# Patient Record
Sex: Male | Born: 1945
Health system: Southern US, Community
[De-identification: ages and names within clinical notes are randomized; demographics above are authoritative.]

## PROBLEM LIST (undated history)

## (undated) DIAGNOSIS — F32A Depression, unspecified: Secondary | ICD-10-CM

## (undated) DIAGNOSIS — F329 Major depressive disorder, single episode, unspecified: Secondary | ICD-10-CM

## (undated) HISTORY — PX: INGUINAL HERNIA REPAIR: SUR1180

## (undated) HISTORY — DX: Depression, unspecified: F32.A

---
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
## 2019-06-26 DIAGNOSIS — R7989 Other specified abnormal findings of blood chemistry: Secondary | ICD-10-CM | POA: Diagnosis not present

## 2019-06-26 DIAGNOSIS — Z0000 Encounter for general adult medical examination without abnormal findings: Secondary | ICD-10-CM | POA: Diagnosis not present

## 2019-06-26 DIAGNOSIS — R69 Illness, unspecified: Secondary | ICD-10-CM | POA: Diagnosis not present

## 2019-06-26 DIAGNOSIS — Z1331 Encounter for screening for depression: Secondary | ICD-10-CM | POA: Diagnosis not present

## 2019-06-26 DIAGNOSIS — E559 Vitamin D deficiency, unspecified: Secondary | ICD-10-CM | POA: Diagnosis not present

## 2019-06-26 DIAGNOSIS — Z13220 Encounter for screening for lipoid disorders: Secondary | ICD-10-CM | POA: Diagnosis not present

## 2019-06-26 DIAGNOSIS — Z125 Encounter for screening for malignant neoplasm of prostate: Secondary | ICD-10-CM | POA: Diagnosis not present

## 2019-07-10 ENCOUNTER — Encounter: Payer: Self-pay | Admitting: Gastroenterology

## 2019-08-06 ENCOUNTER — Other Ambulatory Visit: Payer: Self-pay

## 2019-08-06 ENCOUNTER — Ambulatory Visit (AMBULATORY_SURGERY_CENTER): Payer: Self-pay | Admitting: *Deleted

## 2019-08-06 VITALS — Temp 97.7°F | Ht 74.0 in | Wt 157.2 lb

## 2019-08-06 DIAGNOSIS — Z1211 Encounter for screening for malignant neoplasm of colon: Secondary | ICD-10-CM

## 2019-08-06 MED ORDER — NA SULFATE-K SULFATE-MG SULF 17.5-3.13-1.6 GM/177ML PO SOLN: 1.0000 | Freq: Once | ORAL | 0 refills | Status: AC

## 2019-08-06 NOTE — Progress Notes (Signed)

## 2019-08-13 ENCOUNTER — Encounter: Payer: Self-pay | Admitting: Gastroenterology

## 2019-08-15 ENCOUNTER — Other Ambulatory Visit: Payer: Self-pay

## 2019-08-15 ENCOUNTER — Encounter: Payer: Self-pay | Admitting: Gastroenterology

## 2019-08-15 ENCOUNTER — Ambulatory Visit (AMBULATORY_SURGERY_CENTER): Payer: Medicare HMO | Admitting: Gastroenterology

## 2019-08-15 VITALS — BP 110/63 | HR 68 | Temp 97.1°F | Resp 13 | Ht 74.0 in | Wt 157.0 lb

## 2019-08-15 DIAGNOSIS — D122 Benign neoplasm of ascending colon: Secondary | ICD-10-CM

## 2019-08-15 DIAGNOSIS — Z1211 Encounter for screening for malignant neoplasm of colon: Secondary | ICD-10-CM

## 2019-08-15 DIAGNOSIS — D125 Benign neoplasm of sigmoid colon: Secondary | ICD-10-CM

## 2019-08-15 DIAGNOSIS — D120 Benign neoplasm of cecum: Secondary | ICD-10-CM

## 2019-08-15 MED ORDER — SODIUM CHLORIDE 0.9 % IV SOLN: 500.0000 mL | Freq: Once | INTRAVENOUS | Status: DC

## 2019-08-15 NOTE — Progress Notes (Signed)
Report given to PACU, vss 

## 2019-08-15 NOTE — Op Note (Signed)
[facility] Patient Name: Benjamin Fernandez Procedure Date: 08/15/2019 9:10 AM MRN: [id number] Endoscopist: Remo Lipps P. Havery Moros , MD Age: 74 Referring MD:  Date of Birth: 1946/02/15 Gender: Male Account #: [id number] Procedure:                Colonoscopy Indications:              Screening for colorectal malignant neoplasm Medicines:                Monitored Anesthesia Care Procedure:                Pre-Anesthesia Assessment:                           - Prior to the procedure, a History and Physical                            was performed, and patient medications and                            allergies were reviewed. The patient's tolerance of                            previous anesthesia was also reviewed. The risks                            and benefits of the procedure and the sedation                            options and risks were discussed with the patient.                            All questions were answered, and informed consent                            was obtained. Prior Anticoagulants: The patient has                            taken no previous anticoagulant or antiplatelet                            agents. ASA Grade Assessment: II - A patient with                            mild systemic disease. After reviewing the risks                            and benefits, the patient was deemed in                            satisfactory condition to undergo the procedure.                           After obtaining informed consent, the colonoscope  was passed under direct vision. Throughout the                            procedure, the patient's blood pressure, pulse, and                            oxygen saturations were monitored continuously. The                            Colonoscope was introduced through the anus and                            advanced to the the cecum, identified by                            appendiceal orifice  and ileocecal valve. The                            colonoscopy was performed without difficulty. The                            patient tolerated the procedure well. The quality                            of the bowel preparation was good. The ileocecal                            valve, appendiceal orifice, and rectum were                            photographed. Scope In: 9:14:12 AM Scope Out: 9:39:39 AM Scope Withdrawal Time: 0 hours 21 minutes 58 seconds  Total Procedure Duration: 0 hours 25 minutes 27 seconds  Findings:                 The perianal and digital rectal examinations were                            normal.                           A diminutive polyp was found in the cecum. The                            polyp was sessile. The polyp was removed with a                            cold snare. Resection and retrieval were complete.                           Three flat and sessile polyps were found in the                            ascending colon. The polyps were 3 mm in size.  These polyps were removed with a cold snare.                            Resection and retrieval were complete.                           A 3 mm polyp was found in the sigmoid colon. The                            polyp was sessile. The polyp was removed with a                            cold snare. Resection and retrieval were complete.                           Multiple small-mouthed diverticula were found in                            the left colon.                           Internal hemorrhoids were found during retroflexion.                           The colon was extremely tortous. The exam was                            otherwise without abnormality. Complications:            No immediate complications. Estimated blood loss:                            Minimal. Estimated Blood Loss:     Estimated blood loss was minimal. Impression:               - Tortous colon which  prolonged this exam.                           - One diminutive polyp in the cecum, removed with a                            cold snare. Resected and retrieved.                           - Three 3 mm polyps in the ascending colon, removed                            with a cold snare. Resected and retrieved.                           - One 3 mm polyp in the sigmoid colon, removed with                            a cold snare. Resected and retrieved.                           -  Diverticulosis in the left colon.                           - Internal hemorrhoids.                           - The examination was otherwise normal. Recommendation:           - Patient has a contact number available for                            emergencies. The signs and symptoms of potential                            delayed complications were discussed with the                            patient. Return to normal activities tomorrow.                            Written discharge instructions were provided to the                            patient.                           - Resume previous diet.                           - Continue present medications.                           - Await pathology results. Remo Lipps P. Lot Medford, MD 08/15/2019 9:45:48 AM This report has been signed electronically.

## 2019-08-15 NOTE — Progress Notes (Signed)
Called to room to assist during endoscopic procedure.  Patient ID and intended procedure confirmed with present staff. Received instructions for my participation in the procedure from the performing physician.  

## 2019-08-15 NOTE — Patient Instructions (Signed)
Handouts on polyps and diverticulosis given to you today  °Await pathology results  ° °YOU HAD AN ENDOSCOPIC PROCEDURE TODAY AT THE [facility]:   Refer to the procedure report that was given to you for any specific questions about what was found during the examination.  If the procedure report does not answer your questions, please call your gastroenterologist to clarify.  If you requested that your care partner not be given the details of your procedure findings, then the procedure report has been included in a sealed envelope for you to review at your convenience later. ° °YOU SHOULD EXPECT: Some feelings of bloating in the abdomen. Passage of more gas than usual.  Walking can help get rid of the air that was put into your GI tract during the procedure and reduce the bloating. If you had a lower endoscopy (such as a colonoscopy or flexible sigmoidoscopy) you may notice spotting of blood in your stool or on the toilet paper. If you underwent a bowel prep for your procedure, you may not have a normal bowel movement for a few days. ° °Please Note:  You might notice some irritation and congestion in your nose or some drainage.  This is from the oxygen used during your procedure.  There is no need for concern and it should clear up in a day or so. ° °SYMPTOMS TO REPORT IMMEDIATELY: ° °Following lower endoscopy (colonoscopy or flexible sigmoidoscopy): ° Excessive amounts of blood in the stool ° Significant tenderness or worsening of abdominal pains ° Swelling of the abdomen that is new, acute ° Fever of 100°F or higher ° °For urgent or emergent issues, a gastroenterologist can be reached at any hour by calling [phone]. °Do not use MyChart messaging for urgent concerns.  ° ° °DIET:  We do recommend a small meal at first, but then you may proceed to your regular diet.  Drink plenty of fluids but you should avoid alcoholic beverages for 24 hours. ° °ACTIVITY:  You should plan to take it easy for the  rest of today and you should NOT DRIVE or use heavy machinery until tomorrow (because of the sedation medicines used during the test).   ° °FOLLOW UP: °Our staff will call the number listed on your records 48-72 hours following your procedure to check on you and address any questions or concerns that you may have regarding the information given to you following your procedure. If we do not reach you, we will leave a message.  We will attempt to reach you two times.  During this call, we will ask if you have developed any symptoms of COVID 19. If you develop any symptoms (ie: fever, flu-like symptoms, shortness of breath, cough etc.) before then, please call [phone].  If you test positive for Covid 19 in the 2 weeks post procedure, please call and report this information to us.   ° °If any biopsies were taken you will be contacted by phone or by letter within the next 1-3 weeks.  Please call us at [phone] if you have not heard about the biopsies in 3 weeks.  ° ° °SIGNATURES/CONFIDENTIALITY: °You and/or your care partner have signed paperwork which will be entered into your electronic medical record.  These signatures attest to the fact that that the information above on your After Visit Summary has been reviewed and is understood.  Full responsibility of the confidentiality of this discharge information lies with you and/or your care-partner.  °

## 2019-08-15 NOTE — Progress Notes (Signed)
Pt's states no medical or surgical changes since previsit or office visit.  JB - temp CW - vitals. 

## 2019-08-17 ENCOUNTER — Telehealth: Payer: Self-pay | Admitting: *Deleted

## 2019-08-17 NOTE — Telephone Encounter (Signed)
  Follow up Call-  Call back number 08/15/2019  Post procedure Call Back phone  # [phone]  Permission to leave phone message Yes     Patient questions:  Do you have a fever, pain , or abdominal swelling? No. Pain Score  0 *  Have you tolerated food without any problems? Yes.    Have you been able to return to your normal activities? Yes.    Do you have any questions about your discharge instructions: Diet   No. Medications  No. Follow up visit  No.  Do you have questions or concerns about your Care? No.  Actions: * If pain score is 4 or above: No action needed, pain <4.  1. Have you developed a fever since your procedure? no  2.   Have you had an respiratory symptoms (SOB or cough) since your procedure? no  3.   Have you tested positive for COVID 19 since your procedure no  4.   Have you had any family members/close contacts diagnosed with the COVID 19 since your procedure?  no   If yes to any of these questions please route to Joylene John, RN and Alphonsa Gin, Therapist, sports.

## 2019-08-21 ENCOUNTER — Encounter: Payer: Self-pay | Admitting: Gastroenterology

## 2019-08-23 DIAGNOSIS — R69 Illness, unspecified: Secondary | ICD-10-CM | POA: Diagnosis not present

## 2019-08-23 DIAGNOSIS — R0609 Other forms of dyspnea: Secondary | ICD-10-CM | POA: Diagnosis not present

## 2019-08-23 DIAGNOSIS — R030 Elevated blood-pressure reading, without diagnosis of hypertension: Secondary | ICD-10-CM | POA: Diagnosis not present

## 2019-09-03 DIAGNOSIS — R0609 Other forms of dyspnea: Secondary | ICD-10-CM | POA: Diagnosis not present

## 2019-09-11 ENCOUNTER — Other Ambulatory Visit (HOSPITAL_COMMUNITY): Payer: Self-pay | Admitting: Internal Medicine

## 2019-09-11 DIAGNOSIS — R0609 Other forms of dyspnea: Secondary | ICD-10-CM

## 2019-09-21 ENCOUNTER — Telehealth (HOSPITAL_COMMUNITY): Payer: Self-pay

## 2019-09-21 NOTE — Telephone Encounter (Signed)
Encounter complete. 

## 2019-09-22 ENCOUNTER — Other Ambulatory Visit (HOSPITAL_COMMUNITY)
Admission: RE | Admit: 2019-09-22 | Discharge: 2019-09-22 | Disposition: A | Discharge status: Home / Self Care | Payer: Medicare HMO | Source: Ambulatory Visit | Attending: Internal Medicine | Admitting: Internal Medicine

## 2019-09-22 DIAGNOSIS — Z01812 Encounter for preprocedural laboratory examination: Secondary | ICD-10-CM | POA: Insufficient documentation

## 2019-09-22 DIAGNOSIS — Z20822 Contact with and (suspected) exposure to covid-19: Secondary | ICD-10-CM | POA: Insufficient documentation

## 2019-09-22 LAB — SARS CORONAVIRUS 2 (TAT 6-24 HRS): SARS Coronavirus 2: NEGATIVE

## 2019-09-26 ENCOUNTER — Ambulatory Visit (HOSPITAL_COMMUNITY)
Admission: RE | Admit: 2019-09-26 | Payer: Medicare HMO | Source: Ambulatory Visit | Attending: Internal Medicine | Admitting: Internal Medicine

## 2019-09-28 ENCOUNTER — Ambulatory Visit: Payer: Medicare HMO | Admitting: Cardiovascular Disease

## 2019-09-28 ENCOUNTER — Encounter: Payer: Self-pay | Admitting: Nurse Practitioner

## 2019-09-28 ENCOUNTER — Encounter: Payer: Self-pay | Admitting: Cardiovascular Disease

## 2019-09-28 ENCOUNTER — Other Ambulatory Visit: Payer: Self-pay

## 2019-09-28 VITALS — BP 108/72 | HR 58 | Ht 74.0 in | Wt 153.2 lb

## 2019-09-28 DIAGNOSIS — I209 Angina pectoris, unspecified: Secondary | ICD-10-CM | POA: Diagnosis not present

## 2019-09-28 DIAGNOSIS — R079 Chest pain, unspecified: Secondary | ICD-10-CM

## 2019-09-28 DIAGNOSIS — R0609 Other forms of dyspnea: Secondary | ICD-10-CM | POA: Insufficient documentation

## 2019-09-28 DIAGNOSIS — Z87891 Personal history of nicotine dependence: Secondary | ICD-10-CM | POA: Diagnosis not present

## 2019-09-28 DIAGNOSIS — R0600 Dyspnea, unspecified: Secondary | ICD-10-CM

## 2019-09-28 NOTE — Patient Instructions (Signed)
Medication Instructions:  Your physician recommends that you continue on your current medications as directed. Please refer to the Current Medication list given to you today.  *If you need a refill on your cardiac medications before your next appointment, please call your pharmacy*   Lab Work: None Ordered If you have labs (blood work) drawn today and your tests are completely normal, you will receive your results only by: Marland Kitchen MyChart Message (if you have MyChart) OR . A paper copy in the mail If you have any lab test that is abnormal or we need to change your treatment, we will call you to review the results.   Testing/Procedures: Your physician has requested that you have an echocardiogram. Echocardiography is a painless test that uses sound waves to create images of your heart. It provides your doctor with information about the size and shape of your heart and how well your heart's chambers and valves are working. This procedure takes approximately one hour. There are no restrictions for this procedure.  Your physician has requested that you have an exercise stress myoview. For further information please visit [email or web address]. Please follow instruction sheet, as given.   Follow-Up: At [facility], you and your health needs are our priority.  As part of our continuing mission to provide you with exceptional heart care, we have created designated Provider Care Teams.  These Care Teams include your primary Cardiologist (physician) and Advanced Practice Providers (APPs -  Physician Assistants and Nurse Practitioners) who all work together to provide you with the care you need, when you need it.  We recommend signing up for the patient portal called "MyChart".  Sign up information is provided on this After Visit Summary.  MyChart is used to connect with patients for Virtual Visits (Telemedicine).  Patients are able to view lab/test results, encounter notes, upcoming appointments, etc.   Non-urgent messages can be sent to your provider as well.   To learn more about what you can do with MyChart, go to [email or web address].    Your next appointment:    As Needed  The format for your next appointment:   Either In Person or Virtual  Provider:   You may see Mertie Moores, MD or one of the following Advanced Practice Providers on your designated Care Team:    Richardson Dopp, PA-C  [place], [place]  Daune Perch, NP    Other Instructions Your Pre-procedure COVID-19 Testing will be done on ______ at _____ at [facility] at [id number], [place], [place] [address]. Once you arrive at the testing site, stay in the right hand lane, go under the building overhang not the tent. If you are tested under the tent your results may not be back before your procedure. Please be on time for your appointment.  After your swab you will be given a mask to wear and instructed to go home and quarantine/no visitors until after your procedure. If you test positive you will be notified and your procedure will be cancelled.

## 2019-09-28 NOTE — Progress Notes (Signed)
Cardiology Office Note:    Date:  09/28/2019   ID:  Benjamin Fernandez, DOB January 13, 1946, MRN [id number]  PCP:  Benjamin Boston, MD  Cardiologist:  Benjamin Fernandez  Electrophysiologist:  None   Referring MD: Benjamin Boston, MD   Chief Complaint  Patient presents with  . Shortness of Breath    History of Present Illness:    Benjamin Fernandez is a 74 y.o. male with a hx of cigarette smoking , bipolar disease,  With recent onset of DOE.  We are asked to see him today by Benjamin Fernandez for further evaluation of his dyspnea on exertion .  DOE with significant exertion  - hauling firewood up stairs for several minute No cp Smokes 1 ppd DOE has been progressive .  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 Depression    bipolar    Past Surgical History:  Procedure Laterality Date  . INGUINAL HERNIA REPAIR Left     Current Medications: Current Meds  Medication Sig  . busPIRone (BUSPAR) 7.5 MG tablet Take 2 tablets by mouth daily.     Allergies:   Patient has no known allergies.   Social History   Socioeconomic History  . Marital status: Significant Other    Spouse name: Not on file  . Number of children: Not on file  . Years of education: Not on file  . Highest education level: Not on file  Occupational History  . Not on file  Tobacco Use  . Smoking status: Current Every Day Smoker    Packs/day: 0.50    Years: 30.00    Pack years: 15.00  . Smokeless tobacco: Never Used  Substance and Sexual Activity  . Alcohol use: Not on file    Comment: rare  . Drug use: Not Currently  . Sexual activity: Not on file  Other Topics Concern  . Not on file  Social History Narrative  . Not on file   Social Determinants of Health   Financial Resource Strain:   . Difficulty of Paying Living Expenses:   Food Insecurity:   .  Worried About Charity fundraiser in the Last Year:   . Arboriculturist in the Last Year:   Transportation Needs:   . Film/video editor (Medical):   Marland Kitchen Lack of Transportation (Non-Medical):   Physical Activity:   . Days of Exercise per Week:   . Minutes of Exercise per Session:   Stress:   . Feeling of Stress :   Social Connections:   . Frequency of Communication with Friends and Family:   . Frequency of Social Gatherings with Friends and Family:   . Attends Religious Services:   . Active Member of Clubs or Organizations:   . Attends Archivist Meetings:   Marland Kitchen Marital Status:      Family History: The patient's family history includes Diabetes in his father; Kidney failure in his mother. There is no history of Colon cancer, Esophageal cancer, Rectal cancer, or Stomach cancer.  ROS:   Please see the history of present illness.     All other systems reviewed and are negative.  EKGs/Labs/Other Studies Reviewed:    The following studies were reviewed today:   EKG:   September 28, 2019: Sinus bradycardia 53 beats a  minute.  No ST or T wave changes.  Recent Labs: No results found for requested labs within last 8760 hours.  Recent Lipid Panel No results found for: CHOL, TRIG, HDL, CHOLHDL, VLDL, LDLCALC, LDLDIRECT  Physical Exam:    VS:  BP 108/72   Pulse (!) 58   Ht 6\' 2"  (1.88 m)   Wt 153 lb 4 oz (69.5 kg)   SpO2 99%   BMI 19.68 kg/m     Wt Readings from Last 3 Encounters:  09/28/19 153 lb 4 oz (69.5 kg)  08/15/19 157 lb (71.2 kg)  08/06/19 157 lb 3.2 oz (71.3 kg)     GEN:  Well nourished, well developed in no acute distress HEENT: Normal NECK: No JVD; No carotid bruits LYMPHATICS: No lymphadenopathy CARDIAC: RRR, no murmurs, rubs, gallops RESPIRATORY:  Clear to auscultation without rales, wheezing or rhonchi  ABDOMEN: Soft, non-tender, non-distended MUSCULOSKELETAL:  No edema; No deformity  SKIN: Warm and dry NEUROLOGIC:  Alert and oriented x  3 PSYCHIATRIC:  Normal affect   ASSESSMENT:    No diagnosis found. PLAN:    In order of problems listed above:  1. Shortness of breath with exertion: Benjamin Fernandez presents with several months of progressive shortness of breath with exertion.  There is a question that he may also still have some burning in his chest with exertion. He has a long history of cigarette smoking.  I would like to do an echocardiogram for further evaluation of his shortness of breath.  I also like to do a stress Myoview study for further evaluation of his shortness of breath and exertional chest tightness/chest burning.  We discussed the fact that if he is found to have some cardiac disease then we will need to be more aggressive with his lipid-lowering.  Presently is LDL cholesterol level is 113 and we would need to aim for an LDL of < 70 if he is found to have CAD .   We will plan on seeing him back on an as-needed basis or as otherwise indicated if one of the above tests show a significant abnormality.  Medication Adjustments/Labs and Tests Ordered: Current medicines are reviewed at length with the patient today.  Concerns regarding medicines are outlined above.  No orders of the defined types were placed in this encounter.  No orders of the defined types were placed in this encounter.   There are no Patient Instructions on file for this visit.   Signed, Benjamin Moores, MD  09/28/2019 9:13 AM    [place]

## 2019-10-16 ENCOUNTER — Other Ambulatory Visit (HOSPITAL_COMMUNITY): Payer: Medicare HMO

## 2019-10-18 DIAGNOSIS — L237 Allergic contact dermatitis due to plants, except food: Secondary | ICD-10-CM | POA: Diagnosis not present

## 2019-10-19 ENCOUNTER — Other Ambulatory Visit (HOSPITAL_COMMUNITY): Payer: Medicare HMO

## 2019-10-19 ENCOUNTER — Encounter (HOSPITAL_COMMUNITY): Payer: Medicare HMO

## 2019-10-30 ENCOUNTER — Other Ambulatory Visit (HOSPITAL_COMMUNITY)
Admission: RE | Admit: 2019-10-30 | Discharge: 2019-10-30 | Disposition: A | Discharge status: Home / Self Care | Payer: Medicare HMO | Source: Ambulatory Visit | Attending: Cardiovascular Disease | Admitting: Cardiovascular Disease

## 2019-10-30 ENCOUNTER — Telehealth (HOSPITAL_COMMUNITY): Payer: Self-pay | Admitting: *Deleted

## 2019-10-30 DIAGNOSIS — Z20822 Contact with and (suspected) exposure to covid-19: Secondary | ICD-10-CM | POA: Insufficient documentation

## 2019-10-30 DIAGNOSIS — Z01812 Encounter for preprocedural laboratory examination: Secondary | ICD-10-CM | POA: Diagnosis not present

## 2019-10-30 LAB — SARS CORONAVIRUS 2 (TAT 6-24 HRS): SARS Coronavirus 2: NEGATIVE

## 2019-10-30 NOTE — Telephone Encounter (Signed)
Left message on voicemail in reference to upcoming appointment scheduled for 11/02/19. Phone number given for a call back so details instructions can be given. Benjamin Fernandez

## 2019-11-02 ENCOUNTER — Ambulatory Visit (HOSPITAL_BASED_OUTPATIENT_CLINIC_OR_DEPARTMENT_OTHER): Payer: Medicare HMO

## 2019-11-02 ENCOUNTER — Other Ambulatory Visit: Payer: Self-pay

## 2019-11-02 ENCOUNTER — Ambulatory Visit (HOSPITAL_COMMUNITY): Payer: Medicare HMO | Attending: Cardiology

## 2019-11-02 ENCOUNTER — Other Ambulatory Visit (HOSPITAL_COMMUNITY): Payer: Medicare HMO

## 2019-11-02 DIAGNOSIS — R0609 Other forms of dyspnea: Secondary | ICD-10-CM

## 2019-11-02 DIAGNOSIS — R0600 Dyspnea, unspecified: Secondary | ICD-10-CM

## 2019-11-02 DIAGNOSIS — R079 Chest pain, unspecified: Secondary | ICD-10-CM

## 2019-11-02 LAB — MYOCARDIAL PERFUSION IMAGING
Estimated workload: 10.1 METS
Exercise duration (min): 9 min
Exercise duration (sec): 10 s
LV dias vol: 92 mL (ref 62–150)
LV sys vol: 41 mL
MPHR: 147 {beats}/min
Peak HR: 127 {beats}/min
Percent HR: 86 %
Rest HR: 84 {beats}/min
SDS: 0
SRS: 0
SSS: 0
TID: 0.93

## 2019-11-02 LAB — ECHOCARDIOGRAM COMPLETE
Height: 74 in
Weight: 2448 oz

## 2019-11-02 MED ORDER — TECHNETIUM TC 99M TETROFOSMIN IV KIT
32.8000 | PACK | Freq: Once | INTRAVENOUS | Status: AC | PRN
  Administered 2019-11-02: 32.8 via INTRAVENOUS
  Filled 2019-11-02: qty 33

## 2019-11-02 MED ORDER — TECHNETIUM TC 99M TETROFOSMIN IV KIT
10.7000 | PACK | Freq: Once | INTRAVENOUS | Status: AC | PRN
  Administered 2019-11-02: 10.7 via INTRAVENOUS
  Filled 2019-11-02: qty 11

## 2019-11-08 DIAGNOSIS — H6121 Impacted cerumen, right ear: Secondary | ICD-10-CM | POA: Diagnosis not present

## 2019-11-08 DIAGNOSIS — N5201 Erectile dysfunction due to arterial insufficiency: Secondary | ICD-10-CM | POA: Diagnosis not present

## 2019-12-14 DIAGNOSIS — R69 Illness, unspecified: Secondary | ICD-10-CM | POA: Diagnosis not present

## 2019-12-14 DIAGNOSIS — N5201 Erectile dysfunction due to arterial insufficiency: Secondary | ICD-10-CM | POA: Diagnosis not present

## 2019-12-14 DIAGNOSIS — R030 Elevated blood-pressure reading, without diagnosis of hypertension: Secondary | ICD-10-CM | POA: Diagnosis not present

## 2020-01-09 DIAGNOSIS — N410 Acute prostatitis: Secondary | ICD-10-CM | POA: Diagnosis not present

## 2020-01-09 DIAGNOSIS — N390 Urinary tract infection, site not specified: Secondary | ICD-10-CM | POA: Diagnosis not present

## 2020-03-24 ENCOUNTER — Ambulatory Visit: Payer: Medicare HMO | Attending: Internal Medicine

## 2020-03-24 DIAGNOSIS — Z23 Encounter for immunization: Secondary | ICD-10-CM

## 2020-03-24 NOTE — Progress Notes (Signed)
   Covid-19 Vaccination Clinic  Name:  Benjamin Fernandez    MRN: [id number] DOB: [birth year]  03/24/2020  Mr. Schoening was observed post Covid-19 immunization for 15 minutes without incident. He was provided with Vaccine Information Sheet and instruction to access the V-Safe system.   Mr. Murtha was instructed to call 911 with any severe reactions post vaccine: Marland Kitchen Difficulty breathing  . Swelling of face and throat  . A fast heartbeat  . A bad rash all over body  . Dizziness and weakness

## 2020-05-23 DIAGNOSIS — Z23 Encounter for immunization: Secondary | ICD-10-CM | POA: Diagnosis not present

## 2020-06-04 DIAGNOSIS — N5201 Erectile dysfunction due to arterial insufficiency: Secondary | ICD-10-CM | POA: Diagnosis not present

## 2020-06-04 DIAGNOSIS — E559 Vitamin D deficiency, unspecified: Secondary | ICD-10-CM | POA: Diagnosis not present

## 2020-06-04 DIAGNOSIS — Z125 Encounter for screening for malignant neoplasm of prostate: Secondary | ICD-10-CM | POA: Diagnosis not present

## 2020-06-11 DIAGNOSIS — F3130 Bipolar disorder, current episode depressed, mild or moderate severity, unspecified: Secondary | ICD-10-CM | POA: Diagnosis not present

## 2020-06-11 DIAGNOSIS — Z713 Dietary counseling and surveillance: Secondary | ICD-10-CM | POA: Diagnosis not present

## 2020-06-11 DIAGNOSIS — Z0000 Encounter for general adult medical examination without abnormal findings: Secondary | ICD-10-CM | POA: Diagnosis not present

## 2020-06-11 DIAGNOSIS — E785 Hyperlipidemia, unspecified: Secondary | ICD-10-CM | POA: Diagnosis not present

## 2020-06-11 DIAGNOSIS — Z1331 Encounter for screening for depression: Secondary | ICD-10-CM | POA: Diagnosis not present

## 2020-06-11 DIAGNOSIS — R69 Illness, unspecified: Secondary | ICD-10-CM | POA: Diagnosis not present

## 2020-09-29 DIAGNOSIS — R69 Illness, unspecified: Secondary | ICD-10-CM | POA: Diagnosis not present

## 2020-09-29 DIAGNOSIS — F411 Generalized anxiety disorder: Secondary | ICD-10-CM | POA: Diagnosis not present

## 2020-10-15 ENCOUNTER — Ambulatory Visit (HOSPITAL_COMMUNITY): Payer: Self-pay | Admitting: Licensed Clinical Social Worker

## 2020-10-29 DIAGNOSIS — R69 Illness, unspecified: Secondary | ICD-10-CM | POA: Diagnosis not present

## 2020-10-29 DIAGNOSIS — F411 Generalized anxiety disorder: Secondary | ICD-10-CM | POA: Diagnosis not present

## 2020-12-01 DIAGNOSIS — W57XXXA Bitten or stung by nonvenomous insect and other nonvenomous arthropods, initial encounter: Secondary | ICD-10-CM | POA: Diagnosis not present

## 2020-12-01 DIAGNOSIS — L988 Other specified disorders of the skin and subcutaneous tissue: Secondary | ICD-10-CM | POA: Diagnosis not present

## 2020-12-12 DIAGNOSIS — E785 Hyperlipidemia, unspecified: Secondary | ICD-10-CM | POA: Diagnosis not present

## 2020-12-12 DIAGNOSIS — N5201 Erectile dysfunction due to arterial insufficiency: Secondary | ICD-10-CM | POA: Diagnosis not present

## 2020-12-12 DIAGNOSIS — R69 Illness, unspecified: Secondary | ICD-10-CM | POA: Diagnosis not present

## 2020-12-12 DIAGNOSIS — F418 Other specified anxiety disorders: Secondary | ICD-10-CM | POA: Diagnosis not present

## 2021-03-06 DIAGNOSIS — F319 Bipolar disorder, unspecified: Secondary | ICD-10-CM | POA: Diagnosis not present

## 2021-03-06 DIAGNOSIS — R69 Illness, unspecified: Secondary | ICD-10-CM | POA: Diagnosis not present

## 2021-03-06 DIAGNOSIS — F411 Generalized anxiety disorder: Secondary | ICD-10-CM | POA: Diagnosis not present

## 2021-03-12 DIAGNOSIS — F5101 Primary insomnia: Secondary | ICD-10-CM | POA: Diagnosis not present

## 2021-03-12 DIAGNOSIS — F411 Generalized anxiety disorder: Secondary | ICD-10-CM | POA: Diagnosis not present

## 2021-03-12 DIAGNOSIS — R69 Illness, unspecified: Secondary | ICD-10-CM | POA: Diagnosis not present

## 2021-04-09 DIAGNOSIS — F411 Generalized anxiety disorder: Secondary | ICD-10-CM | POA: Diagnosis not present

## 2021-04-09 DIAGNOSIS — F17200 Nicotine dependence, unspecified, uncomplicated: Secondary | ICD-10-CM | POA: Diagnosis not present

## 2021-04-09 DIAGNOSIS — R69 Illness, unspecified: Secondary | ICD-10-CM | POA: Diagnosis not present

## 2021-04-09 DIAGNOSIS — F319 Bipolar disorder, unspecified: Secondary | ICD-10-CM | POA: Diagnosis not present

## 2021-04-09 DIAGNOSIS — F5101 Primary insomnia: Secondary | ICD-10-CM | POA: Diagnosis not present

## 2021-05-12 DIAGNOSIS — F319 Bipolar disorder, unspecified: Secondary | ICD-10-CM | POA: Diagnosis not present

## 2021-05-12 DIAGNOSIS — F5101 Primary insomnia: Secondary | ICD-10-CM | POA: Diagnosis not present

## 2021-05-12 DIAGNOSIS — R69 Illness, unspecified: Secondary | ICD-10-CM | POA: Diagnosis not present

## 2021-05-12 DIAGNOSIS — F411 Generalized anxiety disorder: Secondary | ICD-10-CM | POA: Diagnosis not present

## 2021-05-12 DIAGNOSIS — F17200 Nicotine dependence, unspecified, uncomplicated: Secondary | ICD-10-CM | POA: Diagnosis not present

## 2021-06-10 DIAGNOSIS — Z125 Encounter for screening for malignant neoplasm of prostate: Secondary | ICD-10-CM | POA: Diagnosis not present

## 2021-06-10 DIAGNOSIS — E785 Hyperlipidemia, unspecified: Secondary | ICD-10-CM | POA: Diagnosis not present

## 2021-06-17 DIAGNOSIS — N401 Enlarged prostate with lower urinary tract symptoms: Secondary | ICD-10-CM | POA: Diagnosis not present

## 2021-06-17 DIAGNOSIS — F3130 Bipolar disorder, current episode depressed, mild or moderate severity, unspecified: Secondary | ICD-10-CM | POA: Diagnosis not present

## 2021-06-17 DIAGNOSIS — J449 Chronic obstructive pulmonary disease, unspecified: Secondary | ICD-10-CM | POA: Diagnosis not present

## 2021-06-17 DIAGNOSIS — R69 Illness, unspecified: Secondary | ICD-10-CM | POA: Diagnosis not present

## 2021-06-17 DIAGNOSIS — Z1339 Encounter for screening examination for other mental health and behavioral disorders: Secondary | ICD-10-CM | POA: Diagnosis not present

## 2021-06-17 DIAGNOSIS — Z0000 Encounter for general adult medical examination without abnormal findings: Secondary | ICD-10-CM | POA: Diagnosis not present

## 2021-06-17 DIAGNOSIS — E785 Hyperlipidemia, unspecified: Secondary | ICD-10-CM | POA: Diagnosis not present

## 2021-06-17 DIAGNOSIS — Z1331 Encounter for screening for depression: Secondary | ICD-10-CM | POA: Diagnosis not present

## 2021-06-17 DIAGNOSIS — R972 Elevated prostate specific antigen [PSA]: Secondary | ICD-10-CM | POA: Diagnosis not present

## 2021-06-18 ENCOUNTER — Other Ambulatory Visit: Payer: Self-pay | Admitting: Internal Medicine

## 2021-06-18 DIAGNOSIS — F17200 Nicotine dependence, unspecified, uncomplicated: Secondary | ICD-10-CM

## 2021-06-26 ENCOUNTER — Ambulatory Visit: Payer: Medicare HMO

## 2021-07-03 ENCOUNTER — Ambulatory Visit
Admission: RE | Admit: 2021-07-03 | Discharge: 2021-07-03 | Disposition: A | Discharge status: Home / Self Care | Payer: Medicare HMO | Source: Ambulatory Visit | Attending: Internal Medicine | Admitting: Internal Medicine

## 2021-07-03 DIAGNOSIS — R69 Illness, unspecified: Secondary | ICD-10-CM | POA: Diagnosis not present

## 2021-07-03 DIAGNOSIS — F17200 Nicotine dependence, unspecified, uncomplicated: Secondary | ICD-10-CM

## 2021-07-03 DIAGNOSIS — F17210 Nicotine dependence, cigarettes, uncomplicated: Secondary | ICD-10-CM | POA: Diagnosis not present

## 2021-07-13 DIAGNOSIS — N401 Enlarged prostate with lower urinary tract symptoms: Secondary | ICD-10-CM | POA: Diagnosis not present

## 2021-07-13 DIAGNOSIS — R3912 Poor urinary stream: Secondary | ICD-10-CM | POA: Diagnosis not present

## 2021-07-13 DIAGNOSIS — R351 Nocturia: Secondary | ICD-10-CM | POA: Diagnosis not present

## 2021-07-24 DIAGNOSIS — F411 Generalized anxiety disorder: Secondary | ICD-10-CM | POA: Diagnosis not present

## 2021-07-24 DIAGNOSIS — F5101 Primary insomnia: Secondary | ICD-10-CM | POA: Diagnosis not present

## 2021-07-24 DIAGNOSIS — F17200 Nicotine dependence, unspecified, uncomplicated: Secondary | ICD-10-CM | POA: Diagnosis not present

## 2021-07-24 DIAGNOSIS — R69 Illness, unspecified: Secondary | ICD-10-CM | POA: Diagnosis not present

## 2021-07-24 DIAGNOSIS — F319 Bipolar disorder, unspecified: Secondary | ICD-10-CM | POA: Diagnosis not present

## 2021-08-24 ENCOUNTER — Other Ambulatory Visit (HOSPITAL_COMMUNITY): Payer: Medicare HMO

## 2021-08-24 DIAGNOSIS — R3915 Urgency of urination: Secondary | ICD-10-CM | POA: Diagnosis not present

## 2021-08-24 DIAGNOSIS — N401 Enlarged prostate with lower urinary tract symptoms: Secondary | ICD-10-CM | POA: Diagnosis not present

## 2021-08-24 DIAGNOSIS — R3912 Poor urinary stream: Secondary | ICD-10-CM | POA: Diagnosis not present

## 2021-09-03 ENCOUNTER — Other Ambulatory Visit: Payer: Self-pay | Admitting: *Deleted

## 2021-09-03 DIAGNOSIS — I714 Abdominal aortic aneurysm, without rupture, unspecified: Secondary | ICD-10-CM

## 2021-09-04 ENCOUNTER — Ambulatory Visit (HOSPITAL_COMMUNITY)
Admission: RE | Admit: 2021-09-04 | Discharge: 2021-09-04 | Disposition: A | Discharge status: Home / Self Care | Payer: Medicare HMO | Source: Ambulatory Visit | Attending: Internal Medicine | Admitting: Internal Medicine

## 2021-09-04 DIAGNOSIS — F5101 Primary insomnia: Secondary | ICD-10-CM | POA: Diagnosis not present

## 2021-09-04 DIAGNOSIS — R69 Illness, unspecified: Secondary | ICD-10-CM | POA: Diagnosis not present

## 2021-09-04 DIAGNOSIS — I714 Abdominal aortic aneurysm, without rupture, unspecified: Secondary | ICD-10-CM | POA: Insufficient documentation

## 2021-09-04 DIAGNOSIS — F17200 Nicotine dependence, unspecified, uncomplicated: Secondary | ICD-10-CM | POA: Diagnosis not present

## 2021-09-04 DIAGNOSIS — F411 Generalized anxiety disorder: Secondary | ICD-10-CM | POA: Diagnosis not present

## 2021-09-04 DIAGNOSIS — F319 Bipolar disorder, unspecified: Secondary | ICD-10-CM | POA: Diagnosis not present

## 2021-10-09 ENCOUNTER — Emergency Department (HOSPITAL_COMMUNITY)
Admission: EM | Admit: 2021-10-09 | Discharge: 2021-10-10 | Disposition: A | Discharge status: Home / Self Care | Payer: Medicare HMO | Attending: Emergency Medicine | Admitting: Emergency Medicine

## 2021-10-09 ENCOUNTER — Other Ambulatory Visit: Payer: Self-pay

## 2021-10-09 ENCOUNTER — Encounter (HOSPITAL_COMMUNITY): Payer: Self-pay | Admitting: *Deleted

## 2021-10-09 ENCOUNTER — Emergency Department (HOSPITAL_COMMUNITY): Payer: Medicare HMO

## 2021-10-09 DIAGNOSIS — R569 Unspecified convulsions: Secondary | ICD-10-CM | POA: Insufficient documentation

## 2021-10-09 DIAGNOSIS — Z20822 Contact with and (suspected) exposure to covid-19: Secondary | ICD-10-CM | POA: Diagnosis not present

## 2021-10-09 DIAGNOSIS — R9431 Abnormal electrocardiogram [ECG] [EKG]: Secondary | ICD-10-CM | POA: Diagnosis not present

## 2021-10-09 DIAGNOSIS — R251 Tremor, unspecified: Secondary | ICD-10-CM | POA: Insufficient documentation

## 2021-10-09 LAB — CBC
HCT: 45.7 % (ref 39.0–52.0)
Hemoglobin: 15.1 g/dL (ref 13.0–17.0)
MCH: 31 pg (ref 26.0–34.0)
MCHC: 33 g/dL (ref 30.0–36.0)
MCV: 93.8 fL (ref 80.0–100.0)
Platelets: 210 10*3/uL (ref 150–400)
RBC: 4.87 MIL/uL (ref 4.22–5.81)
RDW: 13.8 % (ref 11.5–15.5)
WBC: 8.1 10*3/uL (ref 4.0–10.5)
nRBC: 0 % (ref 0.0–0.2)

## 2021-10-09 LAB — BASIC METABOLIC PANEL
Anion gap: 10 (ref 5–15)
BUN: 21 mg/dL (ref 8–23)
CO2: 24 mmol/L (ref 22–32)
Calcium: 9 mg/dL (ref 8.9–10.3)
Chloride: 104 mmol/L (ref 98–111)
Creatinine, Ser: 1.55 mg/dL — ABNORMAL HIGH (ref 0.61–1.24)
GFR, Estimated: 46 mL/min — ABNORMAL LOW (ref >60)
Glucose, Bld: 97 mg/dL (ref 70–99)
Potassium: 3.6 mmol/L (ref 3.5–5.1)
Sodium: 138 mmol/L (ref 135–145)

## 2021-10-09 LAB — RESP PANEL BY RT-PCR (FLU A&B, COVID) ARPGX2
Influenza A by PCR: NEGATIVE
Influenza B by PCR: NEGATIVE
SARS Coronavirus 2 by RT PCR: NEGATIVE

## 2021-10-09 LAB — PROTIME-INR
INR: 0.9 (ref 0.8–1.2)
Prothrombin Time: 12.3 seconds (ref 11.4–15.2)

## 2021-10-09 LAB — ETHANOL: Alcohol, Ethyl (B): <10 mg/dL (ref <10)

## 2021-10-09 LAB — CBG MONITORING, ED: Glucose-Capillary: 103 mg/dL — ABNORMAL HIGH (ref 70–99)

## 2021-10-09 NOTE — ED Triage Notes (Signed)
The pt reports that he has been shaking all over for the past hour  it started when he was lying in the bed  no other complaints ?

## 2021-10-09 NOTE — Discharge Instructions (Signed)
Recommend following up with your primary care doctor and with a neurologist.  If you develop any further episodes of shaking, any episodes of passing out, numbness, weakness or other new concerning symptom, come back to ER for reassessment. ?

## 2021-10-09 NOTE — ED Provider Triage Note (Signed)
Emergency Medicine Provider Triage Evaluation Note ? ?Benjamin Fernandez , a 76 y.o. male  was evaluated in triage.  Pt complains of uncontrollable shaking.  Patient reports last night began shaking uncontrollably, this resolved itself and went to bed.  Patient reports he woke up with continued shaking.  The patient states that he did have alcohol last night, only 1 drink of tequila, denies excessive alcohol use.  Patient denies any other drug use.  Patient denies any history of Parkinson's disease.  Patient denies any lightheadedness, dizziness, weakness or numbness.  Patient denies any chest pain or shortness of breath. ? ?Review of Systems  ?Positive:  ?Negative:  ? ?Physical Exam  ?BP (!) 148/121   Pulse 81   Temp 97.6 ?F (36.4 ?C)   Resp 18   Ht '6\' 2"'$  (1.88 m)   Wt 69.4 kg   SpO2 (!) 88%   BMI 19.64 kg/m?  ?Gen:   Awake, no distress   ?Resp:  Normal effort  ?MSK:   Moves extremities without difficulty  ?Other:  No focal neurodeficits on examination.  5 out of 5 strength upper and lower extremities.  Patient noted to be shaking uncontrollably.  Having issues with fine movements. ? ?Medical Decision Making  ?Medically screening exam initiated at 5:24 PM.  Appropriate orders placed.  Benjamin Fernandez was informed that the remainder of the evaluation will be completed by another provider, this initial triage assessment does not replace that evaluation, and the importance of remaining in the ED until their evaluation is complete. ? ? ?  ?Azucena Cecil, PA-C ?10/09/21 1725 ? ?

## 2021-10-09 NOTE — ED Provider Notes (Signed)
?Benjamin Fernandez ?Provider Note ? ? ?CSN: [id number] ?Arrival date & time: 10/09/21  1707 ? ?  ? ?History ? ?Chief Complaint  ?Patient presents with  ? shaking all over his body  ? ? ?Benjamin Fernandez is a 76 y.o. male.  Presenting to the ER due to concern for episode of shaking.  Patient states that he has had this episode last night, states that had shaking in both of his arms and legs.  Resolved.  Slept without issues.  Had another episode this afternoon which prompted visit to ER.  He states that at no point was the shaking on just one side of his body.  He had no change in mental status, states that he was alert and aware of what was happening throughout the entire process.  He did not feel weak or numb in any of his extremities. ? ?Per triage provider, patient was having shaking in all 4 extremities but while having the shaking he was still able to demonstrate 5 out of 5 strength. ? ?No ongoing shaking since getting back to a room. Currently has no symptoms. Denies heavy alcohol use.  ? ?HPI ? ?  ? ?Home Medications ?Prior to Admission medications   ?Medication Sig Start Date End Date Taking? Authorizing Provider  ?busPIRone (BUSPAR) 7.5 MG tablet Take 2 tablets by mouth daily. 08/24/19   [provider]  ?   ? ?Allergies    ?Patient has no known allergies.   ? ?Review of Systems   ?Review of Systems  ?Constitutional:  Negative for chills and fever.  ?HENT:  Negative for ear pain and sore throat.   ?Eyes:  Negative for pain and visual disturbance.  ?Respiratory:  Negative for cough and shortness of breath.   ?Cardiovascular:  Negative for chest pain and palpitations.  ?Gastrointestinal:  Negative for abdominal pain and vomiting.  ?Genitourinary:  Negative for dysuria and hematuria.  ?Musculoskeletal:  Negative for arthralgias and back pain.  ?Skin:  Negative for color change and rash.  ?Neurological:  Negative for seizures and syncope.  ?     Shaking  ?All other systems  reviewed and are negative. ? ?Physical Exam ?Updated Vital Signs ?BP 121/69 (BP Location: Right Arm)   Pulse 81   Temp 98 ?F (36.7 ?C) (Oral)   Resp 14   Ht '6\' 2"'$  (1.88 m)   Wt 69.4 kg   SpO2 100%   BMI 19.64 kg/m?  ?Physical Exam ?Vitals and nursing note reviewed.  ?Constitutional:   ?   General: He is not in acute distress. ?   Appearance: He is well-developed.  ?HENT:  ?   Head: Normocephalic and atraumatic.  ?Eyes:  ?   Conjunctiva/sclera: Conjunctivae normal.  ?Cardiovascular:  ?   Rate and Rhythm: Normal rate and regular rhythm.  ?   Heart sounds: No murmur heard. ?Pulmonary:  ?   Effort: Pulmonary effort is normal. No respiratory distress.  ?   Breath sounds: Normal breath sounds.  ?Abdominal:  ?   Palpations: Abdomen is soft.  ?   Tenderness: There is no abdominal tenderness.  ?Musculoskeletal:     ?   General: No swelling.  ?   Cervical back: Neck supple.  ?Skin: ?   General: Skin is warm and dry.  ?   Capillary Refill: Capillary refill takes less than 2 seconds.  ?Neurological:  ?   Mental Status: He is alert.  ?   Comments: AAOx3 ?CN 2-12 intact, speech clear  visual fields intact ?5/5 strength in b/l UE and LE ?Sensation to light touch intact in b/l UE and LE ?Normal FNF ?Normal gait  ?Psychiatric:     ?   Mood and Affect: Mood normal.  ? ? ?ED Results / Procedures / Treatments   ?Labs ?(all labs ordered are listed, but only abnormal results are displayed) ?Labs Reviewed  ?BASIC METABOLIC PANEL - Abnormal; Notable for the following components:  ?    Result Value  ? Creatinine, Ser 1.55 (*)   ? GFR, Estimated 46 (*)   ? All other components within normal limits  ?CBG MONITORING, ED - Abnormal; Notable for the following components:  ? Glucose-Capillary 103 (*)   ? All other components within normal limits  ?RESP PANEL BY RT-PCR (FLU A&B, COVID) [id number]  ?CBC  ?PROTIME-INR  ?ETHANOL  ? ? ?EKG ?EKG Interpretation ? ?Date/Time:  Friday Oct 09 2021 22:08:07 EDT ?Ventricular Rate:  73 ?PR  Interval:  144 ?QRS Duration: 86 ?QT Interval:  374 ?QTC Calculation: 413 ?R Axis:   76 ?Text Interpretation: Sinus rhythm Right atrial enlargement Borderline T abnormalities, inferior leads No significant change since last tracing Confirmed by Blanchie Dessert [phone]) on 10/10/2021 3:11:49 PM ? ?Radiology ?CT Head Wo Contrast ? ?Result Date: 10/09/2021 ?CLINICAL DATA:  Seizure. EXAM: CT HEAD WITHOUT CONTRAST TECHNIQUE: Contiguous axial images were obtained from the base of the skull through the vertex without intravenous contrast. RADIATION DOSE REDUCTION: This exam was performed according to the departmental dose-optimization program which includes automated exposure control, adjustment of the mA and/or kV according to patient size and/or use of iterative reconstruction technique. COMPARISON:  None Available. FINDINGS: Brain: The ventricles and sulci are appropriate size for the patient's age. The gray-white matter discrimination is preserved. There is no acute intracranial hemorrhage. No mass effect or midline shift. No extra-axial fluid collection. Vascular: No hyperdense vessel or unexpected calcification. Skull: Normal. Negative for fracture or focal lesion. Sinuses/Orbits: No acute finding. Other: None IMPRESSION: No acute intracranial pathology. Electronically Signed   By: Anner Crete M.D.   On: 10/09/2021 22:50  ? ?DG Chest Portable 1 View ? ?Result Date: 10/09/2021 ?CLINICAL DATA:  Shaking EXAM: PORTABLE CHEST 1 VIEW COMPARISON:  None Available. FINDINGS: The heart size and mediastinal contours are within normal limits. Both lungs are clear. The visualized skeletal structures are unremarkable. IMPRESSION: No active disease. Electronically Signed   By: Ulyses Jarred M.D.   On: 10/09/2021 21:36   ? ?Procedures ?Procedures  ? ? ?Medications Ordered in ED ?Medications - No data to display ? ?ED Course/ Medical Decision Making/ A&P ?  ?                        ?Medical Decision Making ?Amount and/or Complexity  of Data Reviewed ?Radiology: ordered. ? ? ?76 year old male presented to ER due to concern for episode of shaking.  When I evaluated patient, he had no ongoing shaking.  Check basic labs, no anemia or electrolyte derangement.  Flu and COVID-negative.  CT head negative.  I independently reviewed CT images.  The fact that patient was completely alert throughout entirety of episode, shaking not consistent with a generalized seizure.  The fact that the patient had only bilateral symptoms is not consistent with focal partial seizure.  Do feel patient would benefit from following up with a neurologist to discuss the symptoms.  Given that the symptoms resolved spontaneously and he does not have any ongoing symptoms and  has stable vital signs, feel he can be discharged and managed in the outpatient setting and does not require admission at this time.  I provided information for neurology referral, also discussed return precautions and discharge patient home. ? ?After the discussed management above, the patient was determined to be safe for discharge.  The patient was in agreement with this plan and all questions regarding their care were answered.  ED return precautions were discussed and the patient will return to the ED with any significant worsening of condition. ? ? ? ? ? ? ? ? ?Final Clinical Impression(s) / ED Diagnoses ?Final diagnoses:  ?Episode of shaking  ? ? ?Rx / DC Orders ?ED Discharge Orders   ? ?      Ordered  ?  Ambulatory referral to Neurology       ?Comments: An appointment is requested in approximately: 2 weeks ? ?Episodes of shaking  ? 10/09/21 2320  ? ?  ?  ? ?  ? ? ?  ?Lucrezia Starch, MD ?10/10/21 2155 ? ?

## 2021-10-16 DIAGNOSIS — R251 Tremor, unspecified: Secondary | ICD-10-CM | POA: Diagnosis not present

## 2021-10-16 DIAGNOSIS — R69 Illness, unspecified: Secondary | ICD-10-CM | POA: Diagnosis not present

## 2021-10-19 DIAGNOSIS — F411 Generalized anxiety disorder: Secondary | ICD-10-CM | POA: Diagnosis not present

## 2021-10-19 DIAGNOSIS — F319 Bipolar disorder, unspecified: Secondary | ICD-10-CM | POA: Diagnosis not present

## 2021-10-19 DIAGNOSIS — F17200 Nicotine dependence, unspecified, uncomplicated: Secondary | ICD-10-CM | POA: Diagnosis not present

## 2021-10-19 DIAGNOSIS — F5101 Primary insomnia: Secondary | ICD-10-CM | POA: Diagnosis not present

## 2021-10-19 DIAGNOSIS — R69 Illness, unspecified: Secondary | ICD-10-CM | POA: Diagnosis not present

## 2021-10-29 DIAGNOSIS — R69 Illness, unspecified: Secondary | ICD-10-CM | POA: Diagnosis not present

## 2021-10-29 DIAGNOSIS — F5101 Primary insomnia: Secondary | ICD-10-CM | POA: Diagnosis not present

## 2021-10-29 DIAGNOSIS — F319 Bipolar disorder, unspecified: Secondary | ICD-10-CM | POA: Diagnosis not present

## 2021-10-29 DIAGNOSIS — F17200 Nicotine dependence, unspecified, uncomplicated: Secondary | ICD-10-CM | POA: Diagnosis not present

## 2021-10-29 DIAGNOSIS — F411 Generalized anxiety disorder: Secondary | ICD-10-CM | POA: Diagnosis not present

## 2021-11-10 ENCOUNTER — Encounter: Payer: Self-pay | Admitting: Neurology

## 2021-11-10 ENCOUNTER — Ambulatory Visit: Payer: Medicare HMO | Admitting: Neurology

## 2021-11-10 VITALS — BP 122/73 | HR 55 | Ht 74.0 in | Wt 170.0 lb

## 2021-11-10 DIAGNOSIS — R251 Tremor, unspecified: Secondary | ICD-10-CM | POA: Diagnosis not present

## 2021-11-10 DIAGNOSIS — F17200 Nicotine dependence, unspecified, uncomplicated: Secondary | ICD-10-CM

## 2021-11-10 DIAGNOSIS — R69 Illness, unspecified: Secondary | ICD-10-CM | POA: Diagnosis not present

## 2021-11-10 NOTE — Patient Instructions (Signed)
Continue current medications Recommend exercise, consider walking 20-minutes a day, 5 days a week Consider smoking cessation, please talk to your primary care doctor regarding ways to stop smoking Follow-up as needed

## 2021-11-10 NOTE — Progress Notes (Signed)
[facility]  PATIENT: Benjamin Fernandez DOB: 1946-06-02  REQUESTING CLINICIAN: Lucrezia Starch, MD HISTORY FROM: Patient and partner  REASON FOR VISIT: Episode of shaking in bed    HISTORICAL  CHIEF COMPLAINT:  Chief Complaint  Patient presents with   New Patient (Initial Visit)    Room 13, Reports 2 episode of shaking in head, shoulders, and arms,  episodes last around 30 mins, states before both events he missed a dose of Lamictal     HISTORY OF PRESENT ILLNESS:  This is a 76 year old gentleman past medical history of bipolar disorder, asthma, BPH who is presenting with episode of generalized shaking.  Patient reports that he had 2 episodes of generalized shaking two days in a row.  Patient described the episode as shaking of the upper extremities bilaterally and head.  During this time he is aware, able to communicate.  The first episode lasted about 30 minutes and he did not present to the ED.  The second episode lasted again about 30 minutes and he reported during this time he was able to take a shower, able to go out and talk to his neighbor who took him to the emergency room.  He did have the head CT which was negative for any acute finding.  Discharged home with neuro follow-up. Since then he has not had any additional events.  Patient reports that he is on Lamictal 150 mg for bipolar disorder, and he felt that with the first event he miss his Lamictal dose and in the last event he may have taken too much Lamictal because he does have pills of 150 mg and pills of 200 mg and he felt like he took both, a total of 350 mg.    Handedness: Right handed   Onset: May 4/5  Seizure Type: Shaking  Current frequency: Only 2 episodes   Any injuries from seizures: None    Seizure risk factors: None reported   Previous ASMs: None   Currenty ASMs: Lamictal but taking it for Bipolar disorder   ASMs side effects: None   Brain Images: Normal Head CT    Previous EEGs: None   OTHER MEDICAL CONDITIONS: Bipolar disorder, Asthma, BPH   REVIEW OF SYSTEMS: Full 14 system review of systems performed and negative with exception of: as noted in the HPI   ALLERGIES: No Known Allergies  HOME MEDICATIONS: Outpatient Medications Prior to Visit  Medication Sig Dispense Refill   albuterol (VENTOLIN HFA) 108 (90 Base) MCG/ACT inhaler 2 puff as needed Inhalation every 4 hrs     ARIPiprazole (ABILIFY) 10 MG tablet Take 10 mg by mouth every morning.     BREO ELLIPTA 100-25 MCG/ACT AEPB Inhale 1 puff into the lungs daily.     lamoTRIgine (LAMICTAL) 150 MG tablet Take 150 mg by mouth 2 (two) times daily.     rosuvastatin (CRESTOR) 10 MG tablet Take 10 mg by mouth at bedtime.     silodosin (RAPAFLO) 8 MG CAPS capsule Take 8 mg by mouth daily.     busPIRone (BUSPAR) 7.5 MG tablet Take 2 tablets by mouth daily.     No facility-administered medications prior to visit.    PAST MEDICAL HISTORY: Past Medical History:  Diagnosis Date   Depression    bipolar    PAST SURGICAL HISTORY: Past Surgical History:  Procedure Laterality Date   INGUINAL HERNIA REPAIR Left     FAMILY HISTORY: Family History  Problem Relation Age of Onset   Kidney failure  Mother    Diabetes Father    Colon cancer Neg Hx    Esophageal cancer Neg Hx    Rectal cancer Neg Hx    Stomach cancer Neg Hx     SOCIAL HISTORY: Social History   Socioeconomic History   Marital status: Significant Other    Spouse name: Not on file   Number of children: Not on file   Years of education: Not on file   Highest education level: Not on file  Occupational History   Not on file  Tobacco Use   Smoking status: Every Day    Packs/day: 0.50    Years: 30.00    Pack years: 15.00    Types: Cigarettes    Passive exposure: Never   Smokeless tobacco: Never  Vaping Use   Vaping Use: Never used  Substance and Sexual Activity   Alcohol use: Yes    Comment: rare   Drug use: Not  Currently   Sexual activity: Not on file  Other Topics Concern   Not on file  Social History Narrative   Not on file   Social Determinants of Health   Financial Resource Strain: Not on file  Food Insecurity: Not on file  Transportation Needs: Not on file  Physical Activity: Not on file  Stress: Not on file  Social Connections: Not on file  Intimate Partner Violence: Not on file    PHYSICAL EXAM  GENERAL EXAM/CONSTITUTIONAL: Vitals:  Vitals:   11/10/21 0947  BP: 122/73  Pulse: (!) 55  Weight: 170 lb (77.1 kg)  Height: '6\' 2"'$  (1.88 m)   Body mass index is 21.83 kg/m. Wt Readings from Last 3 Encounters:  11/10/21 170 lb (77.1 kg)  10/09/21 153 lb (69.4 kg)  11/02/19 153 lb (69.4 kg)   Patient is in no distress; well developed, nourished and groomed; neck is supple  EYES: Pupils round and reactive to light, Visual fields full to confrontation, Extraocular movements intacts,  No results found.  MUSCULOSKELETAL: Gait, strength, tone, movements noted in Neurologic exam below  NEUROLOGIC: MENTAL STATUS:      View : No data to display.         awake, alert, oriented to person, place and time recent and remote memory intact normal attention and concentration language fluent, comprehension intact, naming intact fund of knowledge appropriate  CRANIAL NERVE:  2nd, 3rd, 4th, 6th - pupils equal and reactive to light, visual fields full to confrontation, extraocular muscles intact, no nystagmus 5th - facial sensation symmetric 7th - facial strength symmetric 8th - hearing intact 9th - palate elevates symmetrically, uvula midline 11th - shoulder shrug symmetric 12th - tongue protrusion midline  MOTOR:  normal bulk and tone, full strength in the BUE, BLE  SENSORY:  normal and symmetric to light touch  COORDINATION:  finger-nose-finger, fine finger movements normal  REFLEXES:  deep tendon reflexes present and symmetric  GAIT/STATION:   normal   DIAGNOSTIC DATA (LABS, IMAGING, TESTING) - I reviewed patient records, labs, notes, testing and imaging myself where available.  Lab Results  Component Value Date   WBC 8.1 10/09/2021   HGB 15.1 10/09/2021   HCT 45.7 10/09/2021   MCV 93.8 10/09/2021   PLT 210 10/09/2021      Component Value Date/Time   NA 138 10/09/2021 1724   K 3.6 10/09/2021 1724   CL 104 10/09/2021 1724   CO2 24 10/09/2021 1724   GLUCOSE 97 10/09/2021 1724   BUN 21 10/09/2021 1724   CREATININE  1.55 (H) 10/09/2021 1724   CALCIUM 9.0 10/09/2021 1724   GFRNONAA 46 (L) 10/09/2021 1724   No results found for: CHOL, HDL, LDLCALC, LDLDIRECT, TRIG No results found for: HGBA1C No results found for: VITAMINB12 No results found for: TSH  Head CT 10/09/21 No acute intracranial pathology.   ASSESSMENT AND PLAN  76 y.o. year old male  with past medical history of bipolar disorder on Lamictal, BPH and asthma who is presenting with bilateral tremor/shaking.  Patient reports having 2 episodes of shaking, during this time he is aware and interactive.  He reported at 1 point he took too much of his Lamictal a total of 350 mg and felt this was related to the tremor/shaking.  I informed patient that bilateral shaking with preserved awareness is not consistent with seizure, the fact that he was able to take a shower, talk to his neighbor who directed him to the hospital is not consistent with a generalized seizure.  He did have a normal head CT, no need to repeat testing.   At this time I recommended smoking cessation and also to increase exercise.  Advised patient to exercise, walking 20 minutes a day, 5 days a week.  He is comfortable with plan, continue to follow with your primary care doctor and return as needed.   1. Tremor   2. Smoker     Patient Instructions  Continue current medications Recommend exercise, consider walking 20-minutes a day, 5 days a week Consider smoking cessation, please talk to your  primary care doctor regarding ways to stop smoking Follow-up as needed   Per [facility] statutes, patients with seizures are not allowed to drive until they have been seizure-free for six months.  Other recommendations include using caution when using heavy equipment or power tools. Avoid working on ladders or at heights. Take showers instead of baths.  Do not swim alone.  Ensure the water temperature is not too high on the home water heater. Do not go swimming alone. Do not lock yourself in a room alone (i.e. bathroom). When caring for infants or small children, sit down when holding, feeding, or changing them to minimize risk of injury to the child in the event you have a seizure. Maintain good sleep hygiene. Avoid alcohol.  Also recommend adequate sleep, hydration, good diet and minimize stress.   During the Seizure  - First, ensure adequate ventilation and place patients on the floor on their left side  Loosen clothing around the neck and ensure the airway is patent. If the patient is clenching the teeth, do not force the mouth open with any object as this can cause severe damage - Remove all items from the surrounding that can be hazardous. The patient may be oblivious to what's happening and may not even know what he or she is doing. If the patient is confused and wandering, either gently guide him/her away and block access to outside areas - Reassure the individual and be comforting - Call 911. In most cases, the seizure ends before EMS arrives. However, there are cases when seizures may last over 3 to 5 minutes. Or the individual may have developed breathing difficulties or severe injuries. If a pregnant patient or a person with diabetes develops a seizure, it is prudent to call an ambulance. - Finally, if the patient does not regain full consciousness, then call EMS. Most patients will remain confused for about 45 to 90 minutes after a seizure, so you must use judgment in calling  for  help. - Avoid restraints but make sure the patient is in a bed with padded side rails - Place the individual in a lateral position with the neck slightly flexed; this will help the saliva drain from the mouth and prevent the tongue from falling backward - Remove all nearby furniture and other hazards from the area - Provide verbal assurance as the individual is regaining consciousness - Provide the patient with privacy if possible - Call for help and start treatment as ordered by the caregiver   After the Seizure (Postictal Stage)  After a seizure, most patients experience confusion, fatigue, muscle pain and/or a headache. Thus, one should permit the individual to sleep. For the next few days, reassurance is essential. Being calm and helping reorient the person is also of importance.  Most seizures are painless and end spontaneously. Seizures are not harmful to others but can lead to complications such as stress on the lungs, brain and the heart. Individuals with prior lung problems may develop labored breathing and respiratory distress.     No orders of the defined types were placed in this encounter.   No orders of the defined types were placed in this encounter.   Return if symptoms worsen or fail to improve.  I have spent a total of 45 minutes dedicated to this patient today, preparing to see patient, performing a medically appropriate examination and evaluation, ordering tests and/or medications and procedures, and counseling and educating the patient/family/caregiver; independently interpreting result and communicating results to the family/patient/caregiver; and documenting clinical information in the electronic medical record.   Alric Ran, MD 11/10/2021, 8:54 PM  [facility] [address] [place], [place] [address] [phone]

## 2021-12-01 DIAGNOSIS — F411 Generalized anxiety disorder: Secondary | ICD-10-CM | POA: Diagnosis not present

## 2021-12-01 DIAGNOSIS — R69 Illness, unspecified: Secondary | ICD-10-CM | POA: Diagnosis not present

## 2021-12-01 DIAGNOSIS — F319 Bipolar disorder, unspecified: Secondary | ICD-10-CM | POA: Diagnosis not present

## 2021-12-01 DIAGNOSIS — F5101 Primary insomnia: Secondary | ICD-10-CM | POA: Diagnosis not present

## 2021-12-01 DIAGNOSIS — F17200 Nicotine dependence, unspecified, uncomplicated: Secondary | ICD-10-CM | POA: Diagnosis not present

## 2022-01-06 DIAGNOSIS — R69 Illness, unspecified: Secondary | ICD-10-CM | POA: Diagnosis not present

## 2022-01-06 DIAGNOSIS — F5101 Primary insomnia: Secondary | ICD-10-CM | POA: Diagnosis not present

## 2022-01-06 DIAGNOSIS — F411 Generalized anxiety disorder: Secondary | ICD-10-CM | POA: Diagnosis not present

## 2022-01-06 DIAGNOSIS — F17200 Nicotine dependence, unspecified, uncomplicated: Secondary | ICD-10-CM | POA: Diagnosis not present

## 2022-01-06 DIAGNOSIS — F319 Bipolar disorder, unspecified: Secondary | ICD-10-CM | POA: Diagnosis not present

## 2022-01-19 DIAGNOSIS — F17200 Nicotine dependence, unspecified, uncomplicated: Secondary | ICD-10-CM | POA: Diagnosis not present

## 2022-01-19 DIAGNOSIS — Z716 Tobacco abuse counseling: Secondary | ICD-10-CM | POA: Diagnosis not present

## 2022-01-19 DIAGNOSIS — R69 Illness, unspecified: Secondary | ICD-10-CM | POA: Diagnosis not present

## 2022-01-19 DIAGNOSIS — Z6821 Body mass index (BMI) 21.0-21.9, adult: Secondary | ICD-10-CM | POA: Diagnosis not present

## 2022-01-19 DIAGNOSIS — H6123 Impacted cerumen, bilateral: Secondary | ICD-10-CM | POA: Diagnosis not present

## 2022-01-19 DIAGNOSIS — F17210 Nicotine dependence, cigarettes, uncomplicated: Secondary | ICD-10-CM | POA: Diagnosis not present

## 2022-01-19 DIAGNOSIS — Z720 Tobacco use: Secondary | ICD-10-CM | POA: Diagnosis not present

## 2022-01-19 DIAGNOSIS — F32A Depression, unspecified: Secondary | ICD-10-CM | POA: Diagnosis not present

## 2022-02-04 DIAGNOSIS — R69 Illness, unspecified: Secondary | ICD-10-CM | POA: Diagnosis not present

## 2022-02-04 DIAGNOSIS — Z23 Encounter for immunization: Secondary | ICD-10-CM | POA: Diagnosis not present

## 2022-02-04 DIAGNOSIS — I739 Peripheral vascular disease, unspecified: Secondary | ICD-10-CM | POA: Diagnosis not present

## 2022-02-04 DIAGNOSIS — F17210 Nicotine dependence, cigarettes, uncomplicated: Secondary | ICD-10-CM | POA: Diagnosis not present

## 2022-02-04 DIAGNOSIS — R918 Other nonspecific abnormal finding of lung field: Secondary | ICD-10-CM | POA: Diagnosis not present

## 2022-02-04 DIAGNOSIS — F329 Major depressive disorder, single episode, unspecified: Secondary | ICD-10-CM | POA: Diagnosis not present

## 2022-02-04 DIAGNOSIS — Z716 Tobacco abuse counseling: Secondary | ICD-10-CM | POA: Diagnosis not present

## 2022-02-04 DIAGNOSIS — J309 Allergic rhinitis, unspecified: Secondary | ICD-10-CM | POA: Diagnosis not present

## 2022-03-10 DIAGNOSIS — F17200 Nicotine dependence, unspecified, uncomplicated: Secondary | ICD-10-CM | POA: Diagnosis not present

## 2022-03-10 DIAGNOSIS — R69 Illness, unspecified: Secondary | ICD-10-CM | POA: Diagnosis not present

## 2022-03-10 DIAGNOSIS — F411 Generalized anxiety disorder: Secondary | ICD-10-CM | POA: Diagnosis not present

## 2022-03-10 DIAGNOSIS — F319 Bipolar disorder, unspecified: Secondary | ICD-10-CM | POA: Diagnosis not present

## 2022-03-10 DIAGNOSIS — F5101 Primary insomnia: Secondary | ICD-10-CM | POA: Diagnosis not present

## 2022-04-08 DIAGNOSIS — F411 Generalized anxiety disorder: Secondary | ICD-10-CM | POA: Diagnosis not present

## 2022-04-08 DIAGNOSIS — F5101 Primary insomnia: Secondary | ICD-10-CM | POA: Diagnosis not present

## 2022-04-08 DIAGNOSIS — F17200 Nicotine dependence, unspecified, uncomplicated: Secondary | ICD-10-CM | POA: Diagnosis not present

## 2022-04-08 DIAGNOSIS — F319 Bipolar disorder, unspecified: Secondary | ICD-10-CM | POA: Diagnosis not present

## 2022-04-08 DIAGNOSIS — R69 Illness, unspecified: Secondary | ICD-10-CM | POA: Diagnosis not present

## 2022-05-11 DIAGNOSIS — F322 Major depressive disorder, single episode, severe without psychotic features: Secondary | ICD-10-CM | POA: Diagnosis not present

## 2022-05-11 DIAGNOSIS — R69 Illness, unspecified: Secondary | ICD-10-CM | POA: Diagnosis not present

## 2022-05-11 DIAGNOSIS — Z23 Encounter for immunization: Secondary | ICD-10-CM | POA: Diagnosis not present

## 2022-05-17 DIAGNOSIS — F17200 Nicotine dependence, unspecified, uncomplicated: Secondary | ICD-10-CM | POA: Diagnosis not present

## 2022-05-17 DIAGNOSIS — F319 Bipolar disorder, unspecified: Secondary | ICD-10-CM | POA: Diagnosis not present

## 2022-05-17 DIAGNOSIS — F411 Generalized anxiety disorder: Secondary | ICD-10-CM | POA: Diagnosis not present

## 2022-05-17 DIAGNOSIS — R69 Illness, unspecified: Secondary | ICD-10-CM | POA: Diagnosis not present

## 2022-05-17 DIAGNOSIS — F5101 Primary insomnia: Secondary | ICD-10-CM | POA: Diagnosis not present

## 2022-08-05 ENCOUNTER — Encounter: Payer: Self-pay | Admitting: Gastroenterology

## 2022-09-05 IMAGING — CT CT HEAD W/O CM
4 series · 17 of 47 positions shown, 19 images · non-contrast
Comparison: None Available.

CLINICAL DATA: Seizure.



[Series 3: head without · axial · non-contrast · 0.45mm/px · z∈[-110,+10]mm · 7 of 33 slices shown, 9 images]
[im 5/33  brain]
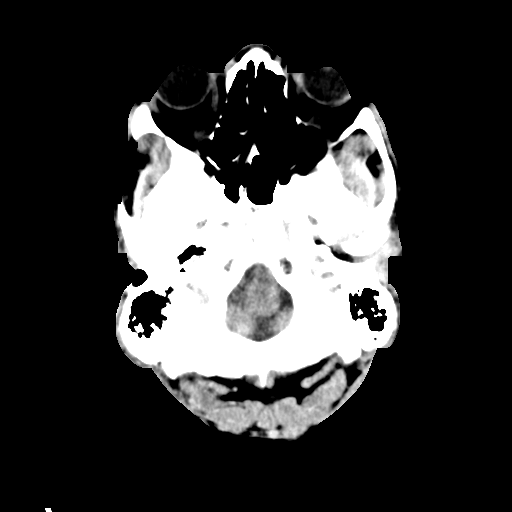
[im 5/33  bone]
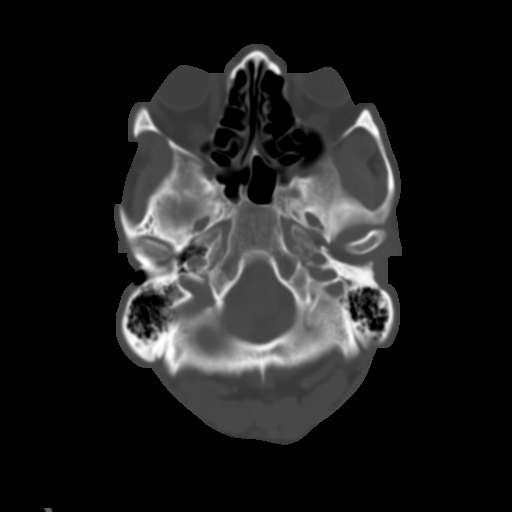
[im 9/33  brain]
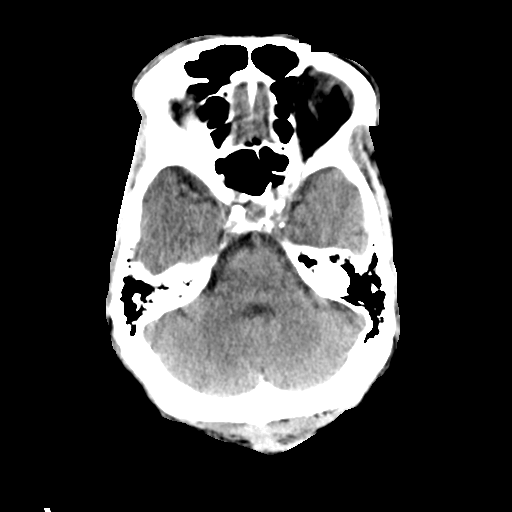
[im 13/33  brain]
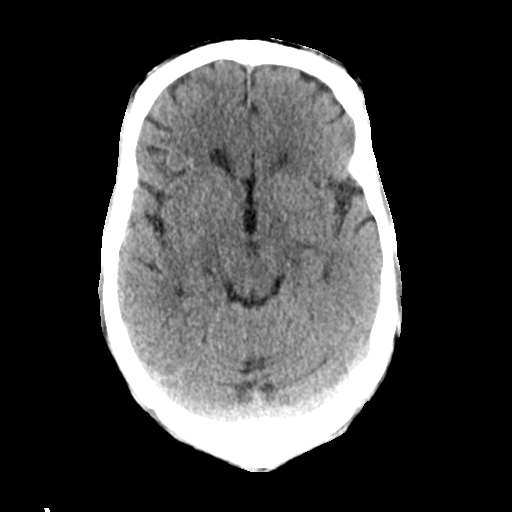
[im 17/33  brain]
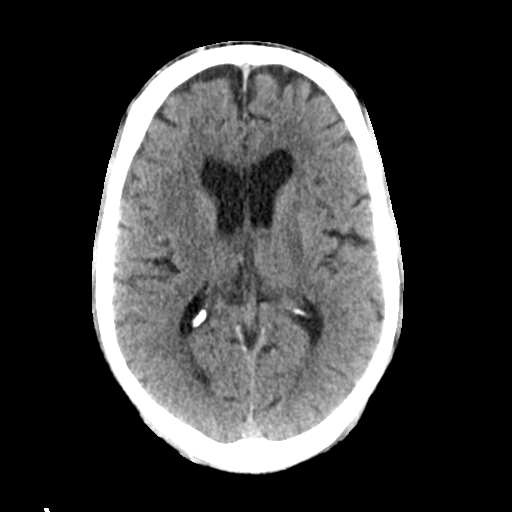
[im 21/33  brain]
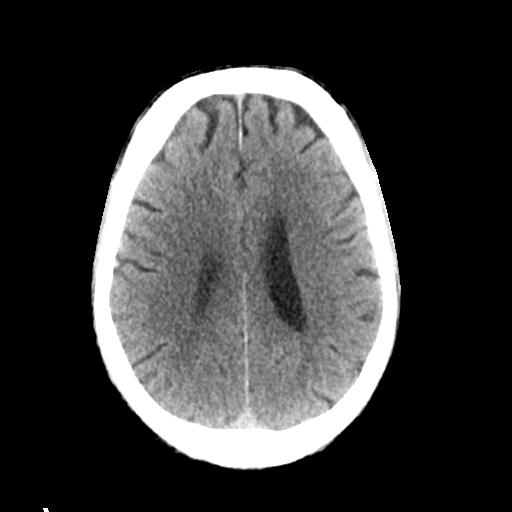
[im 21/33  bone]
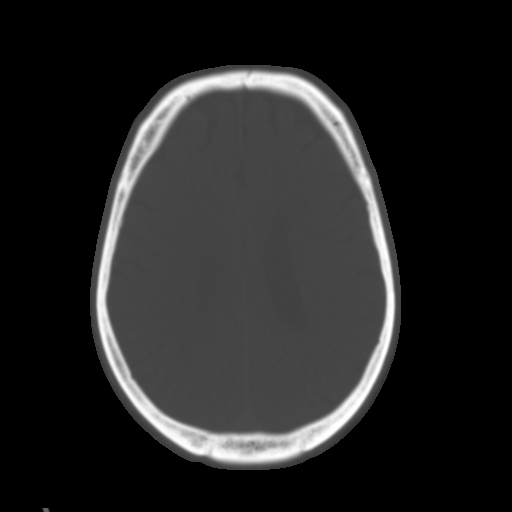
[im 25/33  brain]
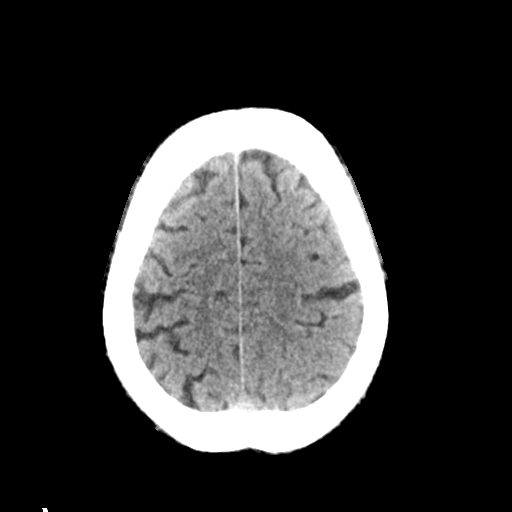
[im 29/33  brain]
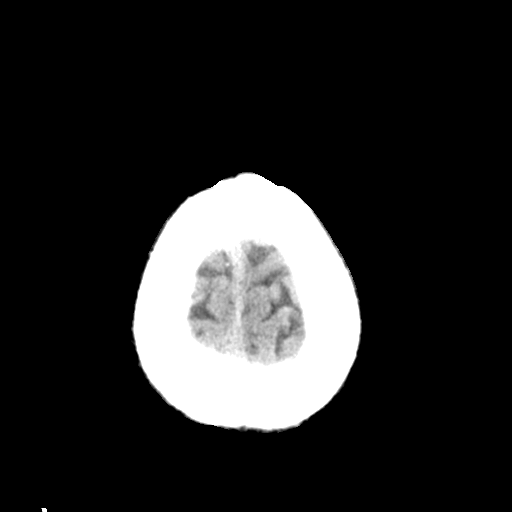

[Series 4: head bone · axial · 0.45mm/px · z∈[-114,-58]mm · 4 of 82 slices shown]
[im 9/82  bone]
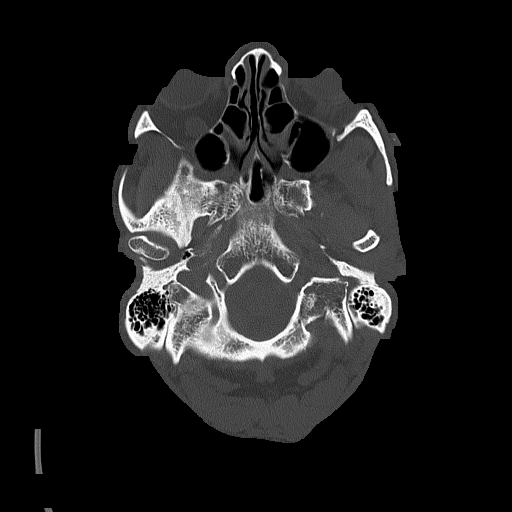
[im 17/82  bone]
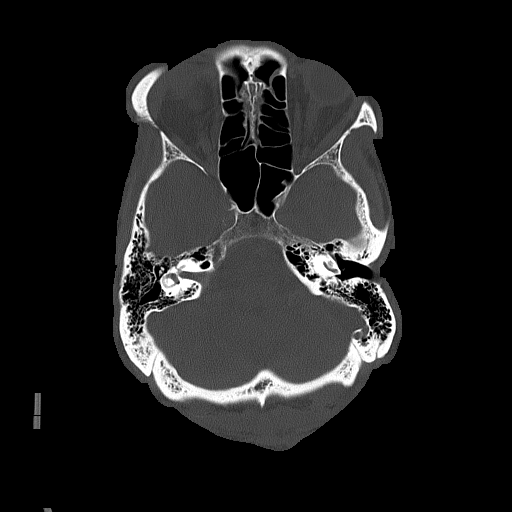
[im 25/82  bone]
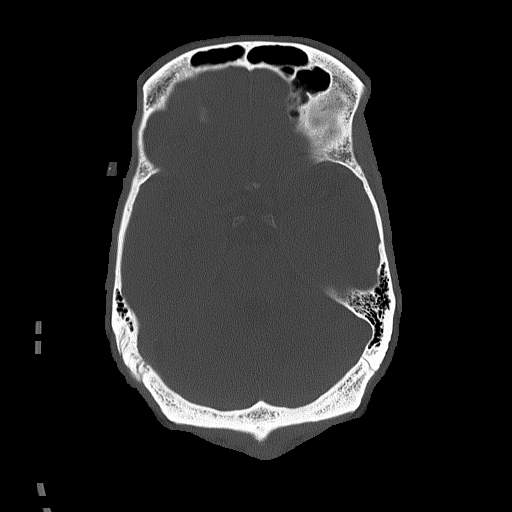
[im 37/82  bone]
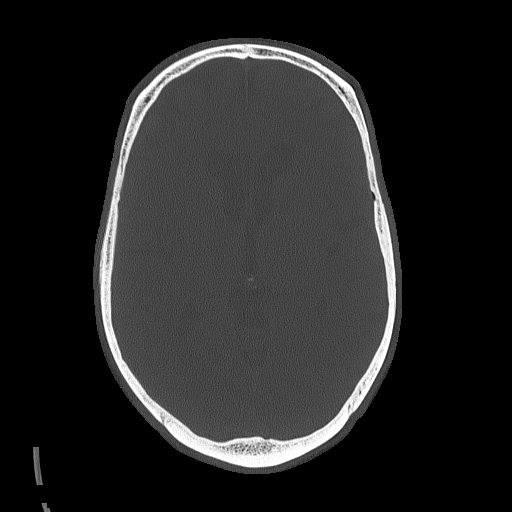

[Series 5: head without cor · coronal · non-contrast · 0.39mm/px · 3 of 75 slices shown]
[im 25/75  brain]
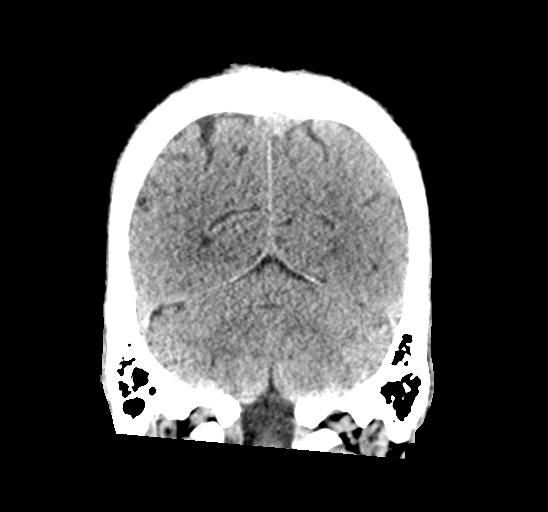
[im 33/75  brain]
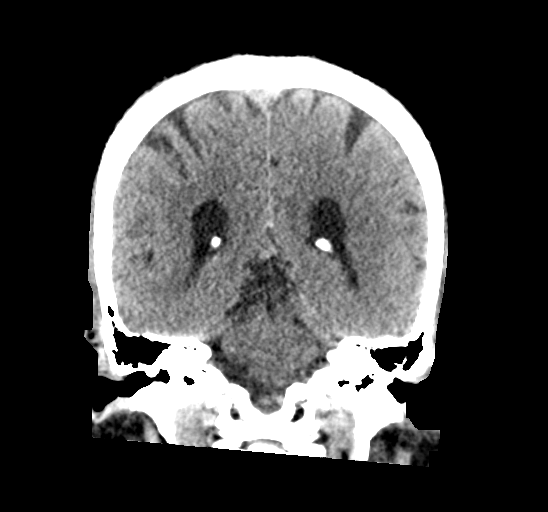
[im 42/75  brain]
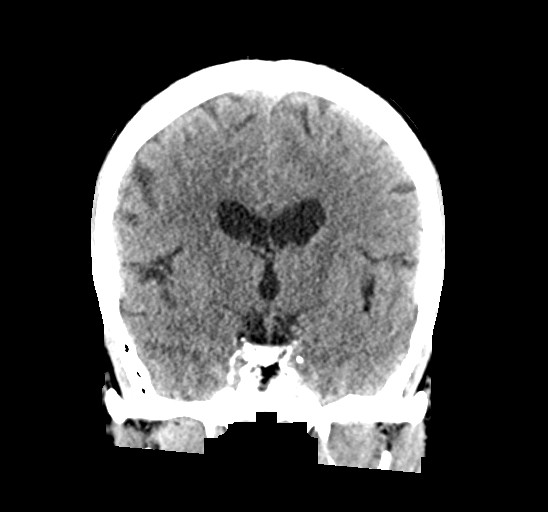

[Series 6: head without sag · sagittal · non-contrast · 0.38mm/px · 3 of 58 slices shown]
[im 20/58  brain]
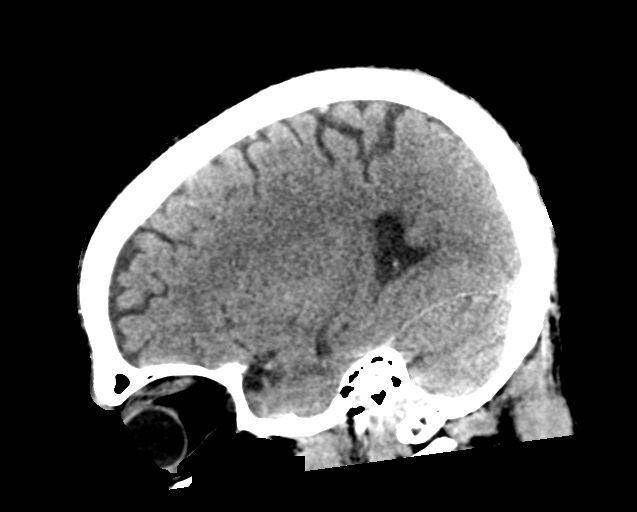
[im 29/58  brain]
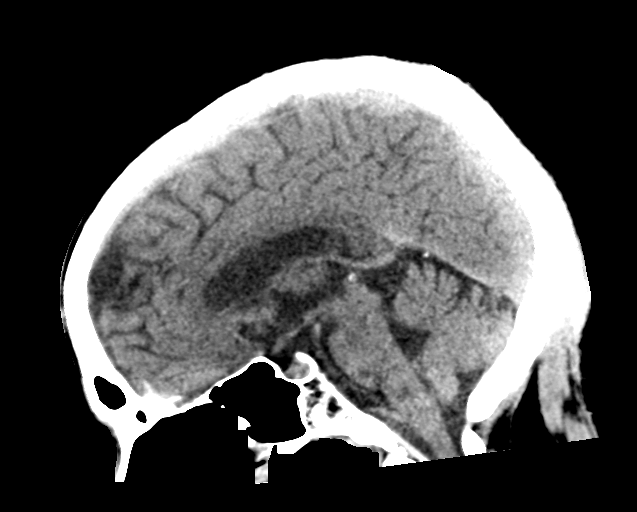
[im 39/58  brain]
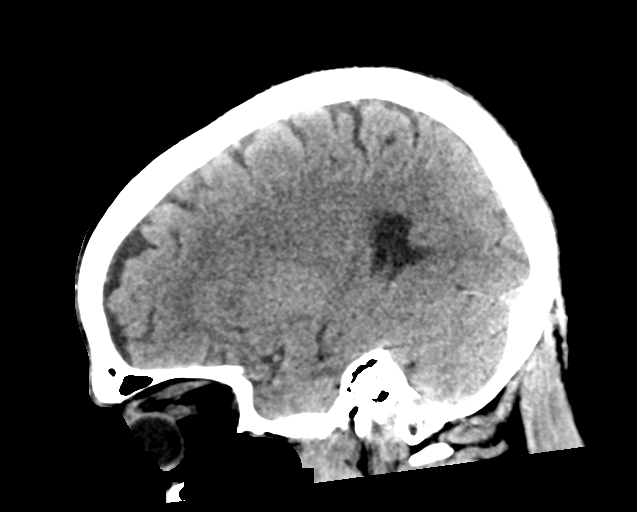

[17 of 47 positions shown; findings below may reference images not displayed]

FINDINGS: Brain: The ventricles and sulci are appropriate size for the
patient's age. The gray-white matter discrimination is preserved.
There is no acute intracranial hemorrhage. No mass effect or midline
shift. No extra-axial fluid collection.

Vascular: No hyperdense vessel or unexpected calcification.

Skull: Normal. Negative for fracture or focal lesion.

Sinuses/Orbits: No acute finding.

Other: None
IMPRESSION: No acute intracranial pathology.
